# Patient Record
Sex: Male | Born: 1990 | Smoking: Never smoker
Health system: Southern US, Community
[De-identification: ages and names within clinical notes are randomized; demographics above are authoritative.]

## PROBLEM LIST (undated history)

## (undated) DIAGNOSIS — K219 Gastro-esophageal reflux disease without esophagitis: Secondary | ICD-10-CM

---

## 2015-05-18 ENCOUNTER — Encounter: Payer: Self-pay | Admitting: *Deleted

## 2015-05-18 ENCOUNTER — Emergency Department (INDEPENDENT_AMBULATORY_CARE_PROVIDER_SITE_OTHER): Payer: BLUE CROSS/BLUE SHIELD

## 2015-05-18 ENCOUNTER — Emergency Department (INDEPENDENT_AMBULATORY_CARE_PROVIDER_SITE_OTHER)
Admission: EM | Admit: 2015-05-18 | Discharge: 2015-05-18 | Disposition: A | Discharge status: Home / Self Care | Payer: BLUE CROSS/BLUE SHIELD | Source: Home / Self Care | Attending: Family Medicine | Admitting: Family Medicine

## 2015-05-18 DIAGNOSIS — S5001XA Contusion of right elbow, initial encounter: Secondary | ICD-10-CM | POA: Diagnosis not present

## 2015-05-18 DIAGNOSIS — M25521 Pain in right elbow: Secondary | ICD-10-CM | POA: Diagnosis not present

## 2015-05-18 HISTORY — DX: Gastro-esophageal reflux disease without esophagitis: K21.9

## 2015-05-18 NOTE — Discharge Instructions (Signed)
Apply ice pack for about 20 minutes, 2 to 3 times daily  Continue until pain decreases.   May take Ibuprofen , 4 tabs every 8 hours with food.    Elbow Contusion An elbow contusion is a deep bruise of the elbow. Contusions are the result of an injury that caused bleeding under the skin. The contusion may turn blue, purple, or yellow. Minor injuries will give you a painless contusion, but more severe contusions may stay painful and swollen for a few weeks.  CAUSES  An elbow contusion comes from a direct force to that area, such as falling on the elbow. SYMPTOMS   Swelling and redness of the elbow.  Bruising of the elbow area.  Tenderness or soreness of the elbow. DIAGNOSIS  You will have a physical exam and will be asked about your history. You may need an X-ray of your elbow to look for a broken bone (fracture).  TREATMENT  A sling or splint may be needed to support your injury. Resting, elevating, and applying cold compresses to the elbow area are often the best treatments for an elbow contusion. Over-the-counter medicines may also be recommended for pain control. HOME CARE INSTRUCTIONS   Put ice on the injured area.  Put ice in a plastic bag.  Place a towel between your skin and the bag.  Leave the ice on for 15-20 minutes, 03-04 times a day.  Only take over-the-counter or prescription medicines for pain, discomfort, or fever as directed by your caregiver.  Rest your injured elbow until the pain and swelling are better.  Elevate your elbow to reduce swelling.  Apply a compression wrap as directed by your caregiver. This can help reduce swelling and motion. You may remove the wrap for sleeping, showers, and baths. If your fingers become numb, cold, or blue, take the wrap off and reapply it more loosely.  Use your elbow only as directed by your caregiver. You may be asked to do range of motion exercises. Do them as directed.  See your caregiver as directed. It is very  important to keep all follow-up appointments in order to avoid any long-term problems with your elbow, including chronic pain or inability to move your elbow normally. SEEK IMMEDIATE MEDICAL CARE IF:   You have increased redness, swelling, or pain in your elbow.  Your swelling or pain is not relieved with medicines.  You have swelling of the hand and fingers.  You are unable to move your fingers or wrist.  You begin to lose feeling in your hand or fingers.  Your fingers or hand become cold or blue. MAKE SURE YOU:   Understand these instructions.  Will watch your condition.  Will get help right away if you are not doing well or get worse.   This information is not intended to replace advice given to you by your health care provider. Make sure you discuss any questions you have with your health care provider.   Document Released: 02/26/2006 Document Revised: 06/12/2011 Document Reviewed: 11/02/2014 Elsevier Interactive Patient Education [employer]

## 2015-05-18 NOTE — ED Notes (Signed)
Pt c/o RT elbow pain and bruising x 2 days after a snowboarding accident.

## 2015-05-18 NOTE — ED Provider Notes (Signed)
CSN: [id number]     Arrival date & time 05/18/15  1749 History   First MD Initiated Contact with Patient 05/18/15 1828     Chief Complaint  Patient presents with  . Elbow Problem      HPI Comments: Patient fell on his right elbow 2 days ago while snowboarding.  He has had persistent pain.  Patient is a 25 y.o. male presenting with arm injury. The history is provided by the patient.  Arm Injury Location:  Elbow Time since incident:  2 days Injury: yes   Mechanism of injury comment:  Fell off snowboard Elbow location:  R elbow Pain details:    Quality:  Aching   Radiates to:  Does not radiate   Severity:  Mild   Onset quality:  Sudden   Duration:  2 days   Timing:  Constant   Progression:  Unchanged Chronicity:  New Prior injury to area:  No Relieved by:  Nothing Worsened by:  Movement Ineffective treatments:  NSAIDs and ice Associated symptoms: stiffness and swelling   Associated symptoms: no decreased range of motion, no muscle weakness, no numbness and no tingling     Past Medical History  Diagnosis Date  . GERD (gastroesophageal reflux disease)    History reviewed. No pertinent past surgical history. History reviewed. No pertinent family history. Social History  Substance Use Topics  . Smoking status: Never Smoker   . Smokeless tobacco: None  . Alcohol Use: Yes    Review of Systems  Musculoskeletal: Positive for stiffness.  All other systems reviewed and are negative.   Allergies  Review of patient's allergies indicates no known allergies.  Home Medications   Prior to Admission medications   Medication Sig Start Date End Date Taking? Authorizing Provider  omeprazole (PRILOSEC) 20 MG capsule Take 20 mg by mouth daily.   Yes Historical Provider, MD   Meds Ordered and Administered this Visit  Medications - No data to display  BP 135/84 mmHg  Pulse 54  Temp(Src) 97.9 F (36.6 C) (Oral)  Resp 16  Ht 5' 11.5" (1.816 m)  Wt 180 lb (81.647 kg)  BMI  24.76 kg/m2  SpO2 100% No data found.   Physical Exam  Constitutional: He is oriented to person, place, and time. He appears well-developed and well-nourished. No distress.  HENT:  Head: Atraumatic.  Eyes: Pupils are equal, round, and reactive to light.  Musculoskeletal:       Right elbow: He exhibits normal range of motion, no swelling, no effusion and no deformity. Tenderness found. Olecranon process tenderness noted. No radial head, no medial epicondyle and no lateral epicondyle tenderness noted.       Arms: Right elbow has full range of motion.  There is tenderness and minimal swelling over the olecranon.  Distal neurovascular function is intact.   Neurological: He is alert and oriented to person, place, and time.  Skin: Skin is warm and dry.  Nursing note and vitals reviewed.   ED Course  Procedures     Imaging Review Dg Elbow Complete Right  05/18/2015  ADDENDUM REPORT: 05/18/2015 18:44 ADDENDUM: There is exostosis in the distal humeral shaft. Electronically Signed   By: Sherian Rein M.D.   On: 05/18/2015 18:44  05/18/2015  CLINICAL DATA:  Injury to the right elbow 2 days ago with right elbow pain. EXAM: RIGHT ELBOW - COMPLETE 3+ VIEW COMPARISON:  None. FINDINGS: There is no evidence of fracture, dislocation, or joint effusion. Soft tissues are unremarkable. IMPRESSION: No  acute fracture or dislocation. Electronically Signed: By: Sherian Rein M.D. On: 05/18/2015 18:28     MDM   1. Contusion of right elbow, initial encounter      Apply ice pack for about 20 minutes, 2 to 3 times daily  Continue until pain decreases.   May take Ibuprofen , 4 tabs every 8 hours with food.  Followup with Dr. Rodney Langton or Dr. Clementeen [facility] ([facility]) if not improving   (Note:  There is no tenderness to palpation over the area where exostosis is noted on distal humeral shaft).  Lattie Haw, MD 05/24/15 [phone]

## 2016-07-04 ENCOUNTER — Emergency Department (INDEPENDENT_AMBULATORY_CARE_PROVIDER_SITE_OTHER)
Admission: EM | Admit: 2016-07-04 | Discharge: 2016-07-04 | Disposition: A | Discharge status: Home / Self Care | Payer: BLUE CROSS/BLUE SHIELD | Source: Home / Self Care | Attending: Family Medicine | Admitting: Family Medicine

## 2016-07-04 ENCOUNTER — Encounter: Payer: Self-pay | Admitting: *Deleted

## 2016-07-04 ENCOUNTER — Emergency Department (INDEPENDENT_AMBULATORY_CARE_PROVIDER_SITE_OTHER): Payer: BLUE CROSS/BLUE SHIELD

## 2016-07-04 DIAGNOSIS — Z113 Encounter for screening for infections with a predominantly sexual mode of transmission: Secondary | ICD-10-CM

## 2016-07-04 DIAGNOSIS — M25532 Pain in left wrist: Secondary | ICD-10-CM | POA: Diagnosis not present

## 2016-07-04 DIAGNOSIS — S63502A Unspecified sprain of left wrist, initial encounter: Secondary | ICD-10-CM | POA: Diagnosis not present

## 2016-07-04 LAB — HIV ANTIBODY (ROUTINE TESTING W REFLEX): HIV 1&2 Ab, 4th Generation: NONREACTIVE

## 2016-07-04 NOTE — ED Provider Notes (Signed)
CSN: [id number]     Arrival date & time 07/04/16  4098 History   First MD Initiated Contact with Patient 07/04/16 [phone]     Chief Complaint  Patient presents with  . Wrist Pain   (Consider location/radiation/quality/duration/timing/severity/associated sxs/prior Treatment) HPI  Corey Page is a 26 y.o. male presenting to UC with c/o 2 weeks of Left wrist pain that started after he fell while playing ping-pong 2 weeks ago.  Pain is mild, aching and sore but only with certain movements. He has been resting and stopped lifting at the gym since injury but he wonders when he can start lifting weights again.  Pain is 1/10 at this time. He is Right hand dominant. Denies prior injury or surgery to that hand or wrist.   Pt also requested to be screened for STIs. He is not having any symptoms and does not know of any specific exposures.   Past Medical History:  Diagnosis Date  . GERD (gastroesophageal reflux disease)    History reviewed. No pertinent surgical history. History reviewed. No pertinent family history. Social History  Substance Use Topics  . Smoking status: Never Smoker  . Smokeless tobacco: Never Used  . Alcohol use Yes    Review of Systems  Musculoskeletal: Positive for arthralgias and myalgias. Negative for joint swelling.  Skin: Negative for color change and wound.  Neurological: Negative for weakness and numbness.    Allergies  Patient has no known allergies.  Home Medications   Prior to Admission medications   Not on File   Meds Ordered and Administered this Visit  Medications - No data to display  BP 118/80 (BP Location: Left Arm)   Pulse 61   Temp 97.8 F (36.6 C) (Oral)   Resp 16   Ht  (1.803 m)   Wt 171 lb (77.6 kg)   SpO2 100%   BMI 23.85 kg/m  No data found.   Physical Exam  Constitutional: He is oriented to person, place, and time. He appears well-developed and well-nourished.  HENT:  Head: Normocephalic and atraumatic.  Eyes: EOM are  normal.  Neck: Normal range of motion.  Cardiovascular: Normal rate.   Pulmonary/Chest: Effort normal.  Musculoskeletal: Normal range of motion. He exhibits tenderness. He exhibits no edema.  Left wrist: no obvious deformity or edema. Tenderness to ulnar aspect. Full ROM.  No snuffbox tenderness.   Neurological: He is alert and oriented to person, place, and time.  Skin: Skin is warm and dry. Capillary refill takes less than 2 seconds.  Left wrist and hand: skin in tact. No ecchymosis or erythema.   Psychiatric: He has a normal mood and affect. His behavior is normal.  Nursing note and vitals reviewed.   Urgent Care Course     Procedures (including critical care time)  Labs Review Labs Reviewed  GC/CHLAMYDIA PROBE AMP  HIV ANTIBODY (ROUTINE TESTING)  RPR    Imaging Review Dg Wrist Complete Left  Result Date: 07/04/2016 CLINICAL DATA:  Fall.  Pain. EXAM: LEFT WRIST - COMPLETE 3+ VIEW COMPARISON:  No recent prior. FINDINGS: No acute bony or joint abnormality identified. No evidence of fracture or dislocation. IMPRESSION: No acute or focal abnormality. Electronically Signed   By: Maisie Fus  Register   On: 07/04/2016 09:59    MDM   1. Left wrist sprain, initial encounter   2. Screening for STDs (sexually transmitted diseases)    Left wrist pain likely due to a mild sprain. Offered wrist splint, pt declined.  Encouraged to  f/u with Sports Medicine in 2-3 weeks if still feeling pain as he may need an MRI of his wrist and further treatment.  Urine and blood sent to lab for GC/chlamydia, HIV, and syphilis testing.     Junius Finner, PA-C 07/04/16 [phone]

## 2016-07-04 NOTE — ED Triage Notes (Signed)
Pt c/o LT wrist pain x 2 wks post fall. No OTC meds.

## 2016-07-04 NOTE — ED Notes (Signed)
Password: Cherre Huger

## 2016-07-05 ENCOUNTER — Telehealth: Payer: Self-pay | Admitting: Emergency Medicine

## 2016-07-05 LAB — GC/CHLAMYDIA PROBE AMP
CT Probe RNA: NOT DETECTED
GC Probe RNA: NOT DETECTED

## 2016-07-05 LAB — RPR

## 2016-07-05 NOTE — Telephone Encounter (Signed)
Labs are negative, clean and clear.

## 2017-06-26 ENCOUNTER — Ambulatory Visit (INDEPENDENT_AMBULATORY_CARE_PROVIDER_SITE_OTHER): Payer: BC Managed Care – PPO | Admitting: Registered Nurse

## 2017-06-26 ENCOUNTER — Encounter (INDEPENDENT_AMBULATORY_CARE_PROVIDER_SITE_OTHER): Payer: Self-pay | Admitting: Registered Nurse

## 2017-06-26 VITALS — BP 119/78 | HR 77 | Temp 98.3°F | Resp 16 | Wt 179.0 lb

## 2017-06-26 DIAGNOSIS — Z20818 Contact with and (suspected) exposure to other bacterial communicable diseases: Secondary | ICD-10-CM

## 2017-06-26 DIAGNOSIS — J029 Acute pharyngitis, unspecified: Secondary | ICD-10-CM

## 2017-06-26 LAB — POCT RAPID STREP A: Rapid Strep A Screen POCT: NEGATIVE

## 2017-06-26 MED ORDER — AMOXICILLIN 500 MG PO TABS
500.00 mg | ORAL_TABLET | Freq: Two times a day (BID) | ORAL | 0 refills | Status: AC
Start: 2017-06-26 — End: 2017-07-03

## 2017-06-26 NOTE — Patient Instructions (Signed)
When You Have a Sore Throat    A sore throat can be painful. There are many reasons why you may have a sore throat. Your healthcare provider will work with you to find the cause of your sore throat. He or she will also find the best treatment for you.  What causes a sore throat?  Sore throats can be caused or worsened by:   Cold or flu viruses   Bacteria   Irritants such as tobacco smoke or air pollution   Acid reflux  A healthy throat  The tonsils are on the sides of the throat near the base of the tongue. They collect viruses and bacteria and help fight infection. The throat (pharynx) is the passage for air. Mucus from the nasal cavity also moves down the passage.  An inflamed throat  The tonsils and pharynx can become inflamed due to a cold or flu virus. Postnasal drip (excess mucus draining from the nasal cavity) can irritate the throat. It can also make the throat or tonsils more likely to be infected by bacteria. Severe, untreated tonsillitis in children or adults can cause a pocket of pus (abscess) to form near the tonsil.  Your evaluation  A medical evaluation can help find the cause of your sore throat. It can also help your healthcare providerchoose the best treatment for you. The evaluation may include a health history, physical exam, and diagnostic tests.  Health history  Your healthcare provider may ask you the following:   How long has the sore throat lasted and how have you been treating it?   Do you have any other symptoms, such as body aches, fever, or cough?   Does your sore throat recur? If so, how often? How many days of school or work have you missed because of a sore throat?   Do you have trouble eating or swallowing?   Have you been told that you snore or have other sleep problems?   Do you have bad breath?   Do you cough up bad-tasting mucus?  Physical exam  During the exam, your healthcare provider checks your ears, nose, and throat for problems. He or she also checks for  swelling in the neck, and may listen to your chest.  Possible tests  Other tests your healthcare provider may perform include:   A throat swab to check for bacteria such asstreptococcus (the bacteria that causes strep throat)   A blood test to check for mononucleosis (a viral infection)   A chest X-ray to rule out pneumonia, especially if you have a cough  Treating a sore throat  Treatment depends on many factors. What is the likely cause? Is the problem recent? Does it keep coming back? In many cases, the best thing to do is to treat the symptoms, rest, and let the problem heal itself. Antibiotics may help clear up some bacterial infections. For cases of severe or recurring tonsillitis, the tonsils may need to be removed.  Relieving your symptoms   Don't smoke, and avoid secondhand smoke.   For children, try throat sprays or Popsicles. Adults and older children may try lozenges.   Drink warm liquids to soothe the throat and help thin mucus. Avoid alcohol, spicy foods, and acidic drinks such as orange juice. These can irritate the throat.   Gargle with warm saltwater (1teaspoon of salt to8ounces of warm water).   Use a humidifier to keep air moist and relieve throat dryness.   Try over-the-counter pain relievers such as acetaminophen   or ibuprofen. Use as directed, and don't exceed the recommended dose. Don't give aspirin to children.   Are antibiotics needed?  If your sore throat is due to a bacterial infection, antibiotics may speed healing and prevent complications. Although group A streptococcus ("strep throat" or GAS) is the major treatable infection for a sore throat, GAS causes only 5% to 15% of sore throats in adults who seek medical care. Most sore throats are caused by cold or flu viruses. And antibiotics don't treat viral illness. In fact, using antibiotics when they're not needed may produce bacteria that are harder to kill. Your healthcare provider will prescribe antibiotics only if he or  she thinks they are likely to help.  If antibiotics are prescribed  Take the medicine exactly as directed. Be sure to finish your prescription even if you're feeling better. And be sure to ask your healthcare provider or pharmacist what side effects are common and what to do about them.  Is surgery needed?  In some cases, tonsils need to be removed. This is often done as outpatient (same-day) surgery. Your healthcare provider may advise removing the tonsils in cases of:   Several severe bouts of tonsillitis in a year. "Severe" episodes include those that lead to missed days of school or work, or that need to be treated with antibiotics.   Tonsillitis that causes breathing problems during sleep   Tonsillitis caused by food particles collecting in pouches in the tonsils (cryptic tonsillitis)  Call your healthcare provider if any of the following occur:   Symptoms worsen, or new symptoms develop.   Swollen tonsils make breathing difficult.   The pain is severe enough to keep you from drinking liquids.   A skin rash, hives, or wheezing develops. Any of these could signal an allergic reaction to antibiotics.   Symptoms don't improve within a week.   Symptoms don't improve within2 to 3days of starting antibiotics.   Date Last Reviewed: 01/02/2015   2000-2018 [employer] [address], [place] [address]. All rights reserved. This information is not intended as a substitute for professional medical care. Always follow your healthcare professional's instructions.

## 2017-06-26 NOTE — Progress Notes (Signed)
[facility]    PROGRESS NOTE      Patient: Steve Pham   Date: 06/26/2017   MRN: [id number]     History reviewed. No pertinent past medical history.  Social History     Social History   . Marital status: Unknown     Spouse name: N/A   . Number of children: N/A   . Years of education: N/A     Occupational History   . Not on file.     Social History Main Topics   . Smoking status: Never Smoker   . Smokeless tobacco: Never Used   . Alcohol use Yes   . Drug use: No   . Sexual activity: Yes     Partners: Female     Other Topics Concern   . Not on file     Social History Narrative   . No narrative on file     History reviewed. No pertinent family history.    ASSESSMENT/PLAN     Sherron Mummert is a 27 y.o. male    Chief Complaint   Patient presents with   . Sore Throat        1. Sore throat  - POCT Rapid Group A Strep  - Culture, Throat (IL/QU)  - amoxicillin (AMOXIL) 500 MG tablet; Take 1 tablet (500 mg total) by mouth 2 (two) times daily.for 7 days  Dispense: 14 tablet; Refill: 0    2. Exposure to strep throat  - amoxicillin (AMOXIL) 500 MG tablet; Take 1 tablet (500 mg total) by mouth 2 (two) times daily.for 7 days  Dispense: 14 tablet; Refill: 0    Please see patient's instruction. Take the prescription if culture back with positive strep. Advil as needed for sore throat     Results for orders placed or performed in visit on 06/26/17   POCT Rapid Group A Strep   Result Value Ref Range    POCT QC Pass     Rapid Strep A Screen POCT Negative  Negative    Comment       Negative Results should be confirmed by throat Cx to confirm absence of Strep A inf.       Risk & Benefits of the new medication(s) were explained to the patient (and family) who verbalized understanding & agreed to the treatment plan. Patient (family) encouraged to contact me/clinical staff with any questions/concerns      MEDICATIONS     Current Outpatient Prescriptions   Medication Sig Dispense Refill   . amoxicillin (AMOXIL) 500 MG  tablet Take 1 tablet (500 mg total) by mouth 2 (two) times daily.for 7 days 14 tablet 0     No current facility-administered medications for this visit.        No Known Allergies    SUBJECTIVE     Chief Complaint   Patient presents with   . Sore Throat        Sore Throat    This is a new problem. The current episode started yesterday (2-3 days ago). The problem has been gradually worsening. The pain is worse on the left side. There has been no fever. Pertinent negatives include no congestion, headaches, shortness of breath, trouble swallowing or vomiting. Associated symptoms comments: Mild nausea today. He has had exposure to strep. He has tried NSAIDs (allergy med) for the symptoms.   Patient states his girlfriend is a Educational psychologist and recently dx'd with strep throat    ROS  Review of Systems   Constitutional: Negative for appetite change, chills, diaphoresis, fatigue and fever.   HENT: Positive for sore throat (2-3 days). Negative for congestion, postnasal drip, sinus pressure, sneezing and trouble swallowing.    Respiratory: Negative for chest tightness, shortness of breath and wheezing.    Cardiovascular: Negative for chest pain.   Gastrointestinal: Negative for abdominal distention, nausea and vomiting.   Skin: Negative for rash.   Neurological: Negative for dizziness and headaches.       The following sections were reviewed this encounter by the provider:   Tobacco  Allergies  Meds  Problems  Med Hx  Surg Hx  Fam Hx  Soc Hx          PHYSICAL EXAM     Vitals:    06/26/17 1854   BP: 119/78   BP Site: Right arm   Patient Position: Sitting   Cuff Size: Large   Pulse: 77   Resp: 16   Temp: 98.3 F (36.8 C)   TempSrc: Oral   SpO2: 97%   Weight: 81.2 kg (179 lb)       Physical Exam   Constitutional: He is oriented to person, place, and time. He appears well-developed and well-nourished. No distress.   HENT:   Head: Normocephalic.   Right Ear: Tympanic membrane, external ear and ear canal normal.   Left Ear:  Tympanic membrane, external ear and ear canal normal.   Nose: Nose normal.   Mouth/Throat: Uvula is midline and mucous membranes are normal. No oropharyngeal exudate, posterior oropharyngeal edema or posterior oropharyngeal erythema.   Eyes: Pupils are equal, round, and reactive to light. Conjunctivae and EOM are normal.   Neck: Normal range of motion. Neck supple.   Cardiovascular: Normal rate, regular rhythm and normal heart sounds.    No murmur heard.  Pulmonary/Chest: Effort normal and breath sounds normal. No respiratory distress.   Neurological: He is alert and oriented to person, place, and time.   Nursing note and vitals reviewed.    Ortho Exam  Neurologic Exam     Mental Status   Oriented to person, place, and time.     Cranial Nerves     CN III, IV, VI   Pupils are equal, round, and reactive to light.  Extraocular motions are normal.       PROCEDURE(S)     Procedures        Signed,  Alinda Money, FNP  06/26/2017

## 2017-06-29 NOTE — Progress Notes (Signed)
Negative thorat culture.  Please F/U with PCP if your symptoms persistent.  Hope you are feeling better.

## 2018-09-11 IMAGING — DX DG WRIST COMPLETE 3+V*L*
4 series · 4 of 4 positions shown · non-contrast
Comparison: No recent prior.

CLINICAL DATA: Fall.  Pain.

EXAM:
LEFT WRIST - COMPLETE 3+ VIEW

[wrist pa]
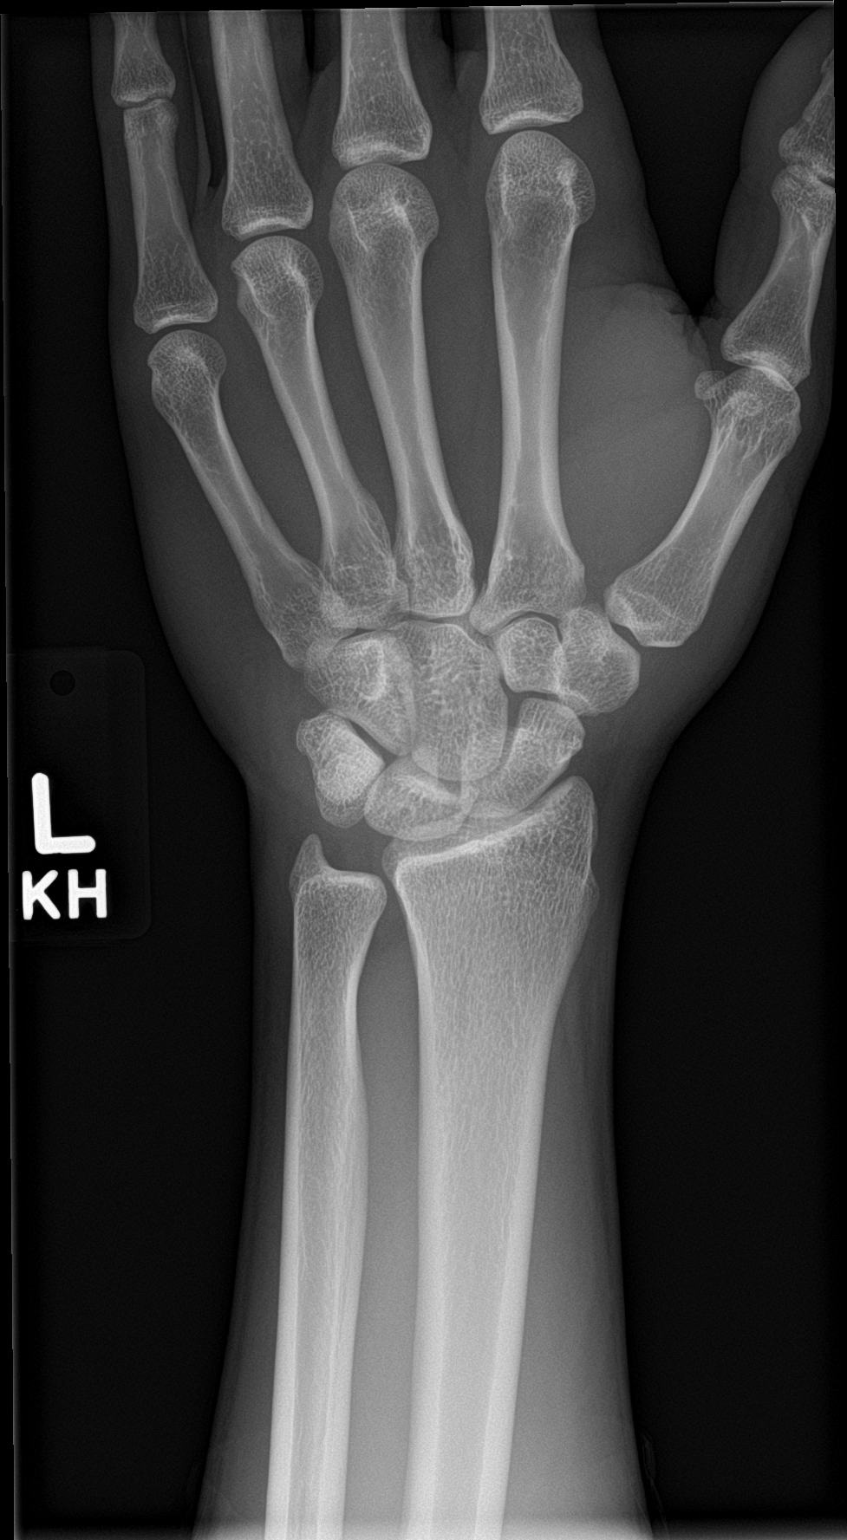

[wrist obl]
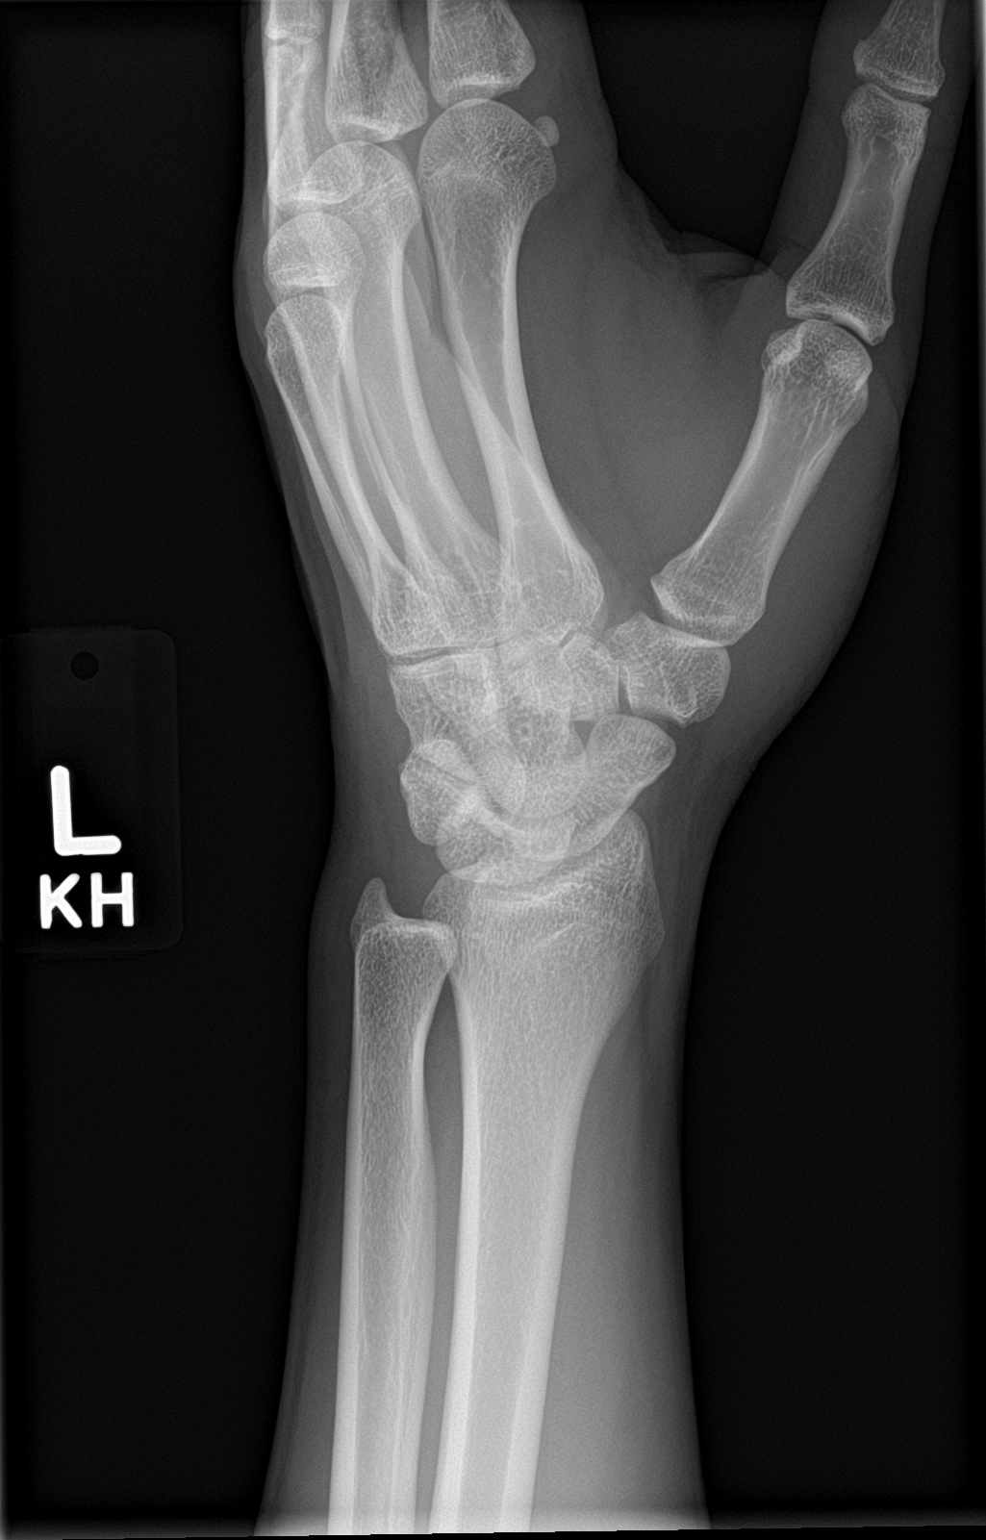

[wrist lat]
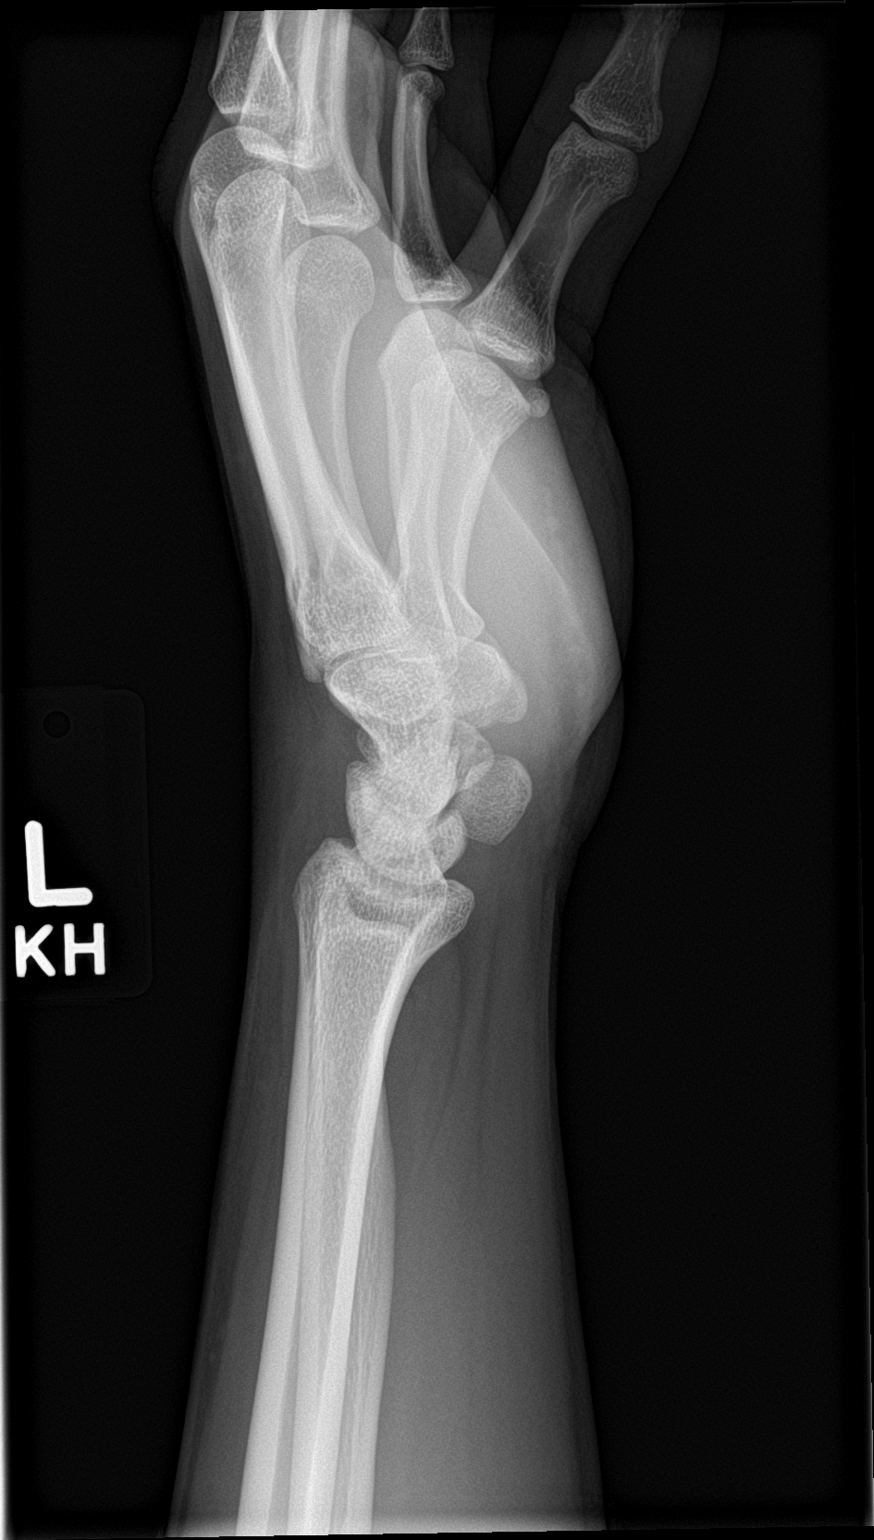

[wrist navicular]
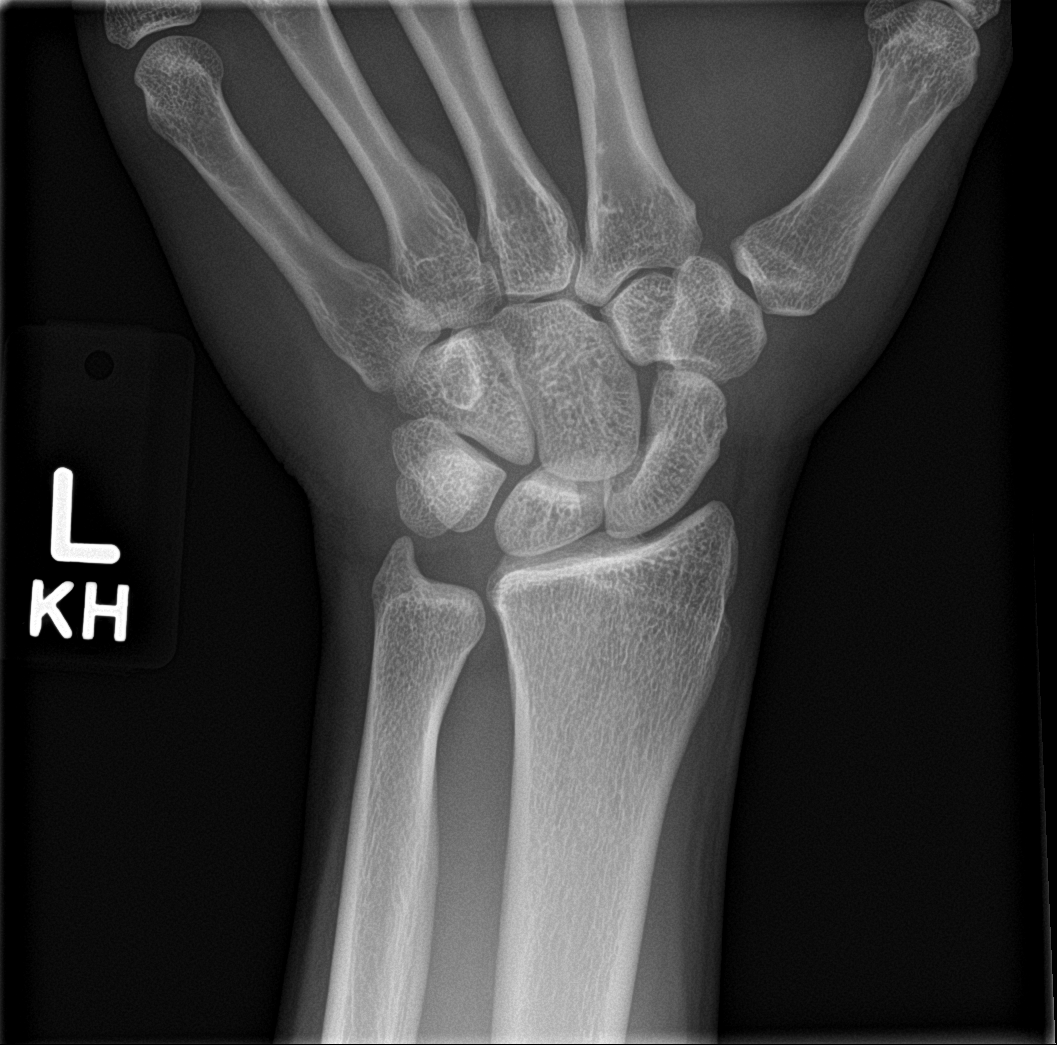

[4 of 4 positions shown; findings below may reference images not displayed]

FINDINGS: No acute bony or joint abnormality identified. No evidence of
fracture or dislocation.
IMPRESSION: No acute or focal abnormality.
